# Patient Record
Sex: Male | Born: 1955 | Race: White | Hispanic: No | Marital: Married | Smoking: Former smoker
Health system: Southern US, Community
[De-identification: ages and names within clinical notes are randomized; demographics above are authoritative.]

## PROBLEM LIST (undated history)

## (undated) ENCOUNTER — Emergency Department (HOSPITAL_BASED_OUTPATIENT_CLINIC_OR_DEPARTMENT_OTHER): Payer: Worker's Compensation | Source: Home / Self Care

## (undated) DIAGNOSIS — J302 Other seasonal allergic rhinitis: Secondary | ICD-10-CM

## (undated) HISTORY — PX: HEMORRHOID SURGERY: SHX153

---

## 2011-09-08 ENCOUNTER — Emergency Department (INDEPENDENT_AMBULATORY_CARE_PROVIDER_SITE_OTHER)

## 2011-09-08 ENCOUNTER — Emergency Department (HOSPITAL_BASED_OUTPATIENT_CLINIC_OR_DEPARTMENT_OTHER)
Admission: EM | Admit: 2011-09-08 | Discharge: 2011-09-08 | Disposition: A | Discharge status: Home / Self Care | Attending: Emergency Medicine | Admitting: Emergency Medicine

## 2011-09-08 ENCOUNTER — Encounter (HOSPITAL_BASED_OUTPATIENT_CLINIC_OR_DEPARTMENT_OTHER): Payer: Self-pay | Admitting: *Deleted

## 2011-09-08 DIAGNOSIS — Y9289 Other specified places as the place of occurrence of the external cause: Secondary | ICD-10-CM | POA: Insufficient documentation

## 2011-09-08 DIAGNOSIS — S62639A Displaced fracture of distal phalanx of unspecified finger, initial encounter for closed fracture: Secondary | ICD-10-CM

## 2011-09-08 DIAGNOSIS — M79609 Pain in unspecified limb: Secondary | ICD-10-CM

## 2011-09-08 DIAGNOSIS — S6980XA Other specified injuries of unspecified wrist, hand and finger(s), initial encounter: Secondary | ICD-10-CM | POA: Insufficient documentation

## 2011-09-08 DIAGNOSIS — X500XXA Overexertion from strenuous movement or load, initial encounter: Secondary | ICD-10-CM

## 2011-09-08 DIAGNOSIS — X58XXXA Exposure to other specified factors, initial encounter: Secondary | ICD-10-CM | POA: Insufficient documentation

## 2011-09-08 DIAGNOSIS — S6990XA Unspecified injury of unspecified wrist, hand and finger(s), initial encounter: Secondary | ICD-10-CM

## 2011-09-08 NOTE — ED Notes (Signed)
Pt c/o left ring finger injury

## 2011-09-08 NOTE — ED Provider Notes (Signed)
History     CSN: [id number]  Arrival date & time 09/08/11  1244   First MD Initiated Contact with Patient 09/08/11 1247      Chief Complaint  Patient presents with  . Finger Injury    (Consider location/radiation/quality/duration/timing/severity/associated sxs/prior treatment) HPI Patient presents emergency room after injuring his left index finger while at work. Patient states he was pulling on a rope his left hand when he felt a pop in his ring finger. Patient states it started to  swell and became warm. Patient continues to have pain near the proximal pharynx of his left index finger. The pain increases with movement. He has not had any fever or or redness. History reviewed. No pertinent past medical history.  Past Surgical History  Procedure Date  . Hemm     History reviewed. No pertinent family history.  History  Substance Use Topics  . Smoking status: Former Games developer  . Smokeless tobacco: Not on file  . Alcohol Use: No      Review of Systems  Constitutional: Negative for fever.  Musculoskeletal: Negative for myalgias.  Neurological: Negative for numbness.    Allergies  Review of patient's allergies indicates no known allergies.  Home Medications  No current outpatient prescriptions on file.  BP 156/94  Pulse 89  Temp(Src) 98.1 F (36.7 C) (Oral)  Resp 18  Ht 5\' 10"  (1.778 m)  Wt 210 lb (95.255 kg)  BMI 30.13 kg/m2  SpO2 100%  Physical Exam  Nursing note and vitals reviewed. Constitutional: He appears well-developed and well-nourished. No distress.  HENT:  Head: Normocephalic and atraumatic.  Right Ear: External ear normal.  Left Ear: External ear normal.  Eyes: Conjunctivae are normal. Right eye exhibits no discharge. Left eye exhibits no discharge. No scleral icterus.  Neck: Neck supple. No tracheal deviation present.  Cardiovascular: Normal rate.   Pulmonary/Chest: Effort normal. No stridor. No respiratory distress.  Musculoskeletal: He  exhibits no edema.       Left hand: He exhibits tenderness and bony tenderness. He exhibits normal range of motion, normal two-point discrimination, normal capillary refill, no deformity and no laceration.       Hands: Neurological: He is alert. Cranial nerve deficit: no gross deficits.  Skin: Skin is warm and dry. No rash noted.  Psychiatric: He has a normal mood and affect.    ED Course  Procedures (including critical care time)  Labs Reviewed - No data to display Dg Finger Ring Left  09/08/2011  *RADIOLOGY REPORT*  Clinical Data: Injury  LEFT RING FINGER 2+V  Comparison: None.  Findings: No acute dislocation. Subtle fracture involving the palmar base of the distal phalanx of the ring finger is suspected. Unremarkable soft tissues.  IMPRESSION: Subtle fracture at the base of the distal phalanx of the ring finger on the palmar aspect.  Original Report Authenticated By: Donavan Burnet, M.D.     Diagnosis: Finger injury   MDM  Patient has an abnormality at the base of the distal phalanx of his right finger however there is no tenderness in this region. I am not certain and that is an acute fracture associated with these complaints today. Patient's pain is located primarily at the base of the proximal phalanx. Patient will be placed in a splint and I will have him follow up with orthopedic doctor.       Celene Kras, MD 09/08/11 1351

## 2011-09-13 ENCOUNTER — Ambulatory Visit (INDEPENDENT_AMBULATORY_CARE_PROVIDER_SITE_OTHER): Payer: Worker's Compensation | Admitting: Family Medicine

## 2011-09-13 ENCOUNTER — Encounter: Payer: Self-pay | Admitting: Family Medicine

## 2011-09-13 VITALS — BP 143/87 | HR 80 | Temp 98.7°F | Ht 70.0 in | Wt 210.0 lb

## 2011-09-13 DIAGNOSIS — S6390XA Sprain of unspecified part of unspecified wrist and hand, initial encounter: Secondary | ICD-10-CM

## 2011-09-13 DIAGNOSIS — IMO0001 Reserved for inherently not codable concepts without codable children: Secondary | ICD-10-CM

## 2011-09-13 NOTE — Assessment & Plan Note (Signed)
Do not think patient has a distal phalanx fracture - no pain at site of questionable fracture.  [place] finger would be a possibility with this finding but he has full flexion and strength of DIP.  No other apparent complete tendon tears (central slip, mallet finger) - and would expect decreased strength and more pain if he had a partial tear of one of these tendons (and typically pain near DIP or PIP, not back near MCP).  Believe he likely strained flexor tendon into 4th digit with resultant swelling, pain.  Reassured patient.  Buddy tape for next 2-3 [place] at work.  Heat or ice, nsaids as needed.  Restrictions - no lifting more than 25 pounds and no climbing (due to concern he may fall from height if having trouble gripping fully with right hand due to pain) for next 3 [place].  F/u in office in 2-3 [place] to reassess.  See instructions for further.

## 2011-09-13 NOTE — Progress Notes (Signed)
Subjective:    Patient ID: Donald Choi, male    DOB: 02-25-1956, 56 y.o.   MRN: [id number]  PCP: Deboraha Sprang  HPI 57 yo M here for left ring finger injury.  Patient reports that on 1/15 while at work he was going to climb a metal slide to assess a sensor that was out of alignment. While going to pull himself up with left hand he felt a snap/pop that he though was on palmar aspect of left ring finger and some pain, warmth, swelling over next few hours. He continued working and finished that day's shift. Next morning felt really stiff - informed work and was evaluated in [facility] ED. Had x-rays that showed a questionable distal phalanx fracture but he has no pain in this area of 4th digit. Was placed in a dorsal splint, buddy taped, and referred here. He has improved and pain is mainly palmar and dorsal around 4th MCP joint - stiffness with flexion. No prior left hand injuries. Right handed.  History reviewed. No pertinent past medical history.  No current outpatient prescriptions on file prior to visit.    Past Surgical History  Procedure Date  . Hemorrhoid surgery     No Known Allergies  History   Social History  . Marital Status: Married    Spouse Name: N/A    Number of Children: N/A  . Years of Education: N/A   Occupational History  . Not on file.   Social History Main Topics  . Smoking status: Former Games developer  . Smokeless tobacco: Not on file  . Alcohol Use: No  . Drug Use: Not on file  . Sexually Active: Not on file   Other Topics Concern  . Not on file   Social History Narrative  . No narrative on file    Family History  Problem Relation Age of Onset  . Hypertension Mother   . Sudden death Neg Hx   . Hyperlipidemia Neg Hx   . Heart attack Neg Hx   . Diabetes Neg Hx     BP 143/87  Pulse 80  Temp(Src) 98.7 F (37.1 C) (Oral)  Ht 5\' 10"  (1.778 m)  Wt 210 lb (95.255 kg)  BMI 30.13 kg/m2  Review of Systems See HPI above.    Objective:   Physical  Exam Gen: NAD  L hand: No gross deformity, bruising.  Minimal swelling 4th digit. No malrotation or angulation of digits. Mild TTP palmar > dorsal at base of 4th digit (near MCP).  No TTP throughout 4th digit or hand otherwise. Able to flex and extend at MCP, PIP, DIP with 5/5 strength and no pain currently. Collateral ligaments intact of MCP, PIP, and DIP joints without pain. NVI distally with < 2 sec cap refill.     Assessment & Plan:  1. Left 4th digit injury - Do not think patient has a distal phalanx fracture - no pain at site of questionable fracture.  [place] finger would be a possibility with this finding but he has full flexion and strength of DIP.  No other apparent complete tendon tears (central slip, mallet finger) - and would expect decreased strength and more pain if he had a partial tear of one of these tendons (and typically pain near DIP or PIP, not back near MCP).  Believe he likely strained flexor tendon into 4th digit with resultant swelling, pain.  Reassured patient.  Buddy tape for next 2-3 [place] at work.  Heat or ice, nsaids as needed.  Restrictions -  no lifting more than 25 pounds and no climbing (due to concern he may fall from height if having trouble gripping fully with right hand due to pain) for next 3 [place].  F/u in office in 2-3 [place] to reassess.  See instructions for further.

## 2011-09-13 NOTE — Patient Instructions (Signed)
Your flexor, extensor tendons and collateral ligaments are intact on exam which is a very good thing based on your description of the incident. You have strained the flexor tendon in your ring finger and have swelling in the tendon sheath as a result of this. Buddy tape your middle and ring fingers together when at work. Only restrictions would be no lifting more than 25 pounds and no climbing until I see you back in 2-3 [place]. Ice or heat 15 minutes at a time as needed. Aleve and/or tylenol as needed for pain. We will call you when the paperwork is finished (should by later this afternoon) to come pick it up. Follow up with me in 2-3 [place].

## 2011-09-27 ENCOUNTER — Ambulatory Visit (INDEPENDENT_AMBULATORY_CARE_PROVIDER_SITE_OTHER): Payer: Worker's Compensation | Admitting: Family Medicine

## 2011-09-27 ENCOUNTER — Encounter: Payer: Self-pay | Admitting: Family Medicine

## 2011-09-27 VITALS — BP 126/81 | HR 82 | Temp 98.3°F | Ht 70.0 in | Wt 215.0 lb

## 2011-09-27 DIAGNOSIS — IMO0001 Reserved for inherently not codable concepts without codable children: Secondary | ICD-10-CM

## 2011-09-27 DIAGNOSIS — S6390XA Sprain of unspecified part of unspecified wrist and hand, initial encounter: Secondary | ICD-10-CM

## 2011-09-27 NOTE — Progress Notes (Signed)
Subjective:    Patient ID: Donald Choi, male    DOB: [birth year], 56 y.o.   MRN: [id number]  PCP: Deboraha Sprang  HPI  56 yo M here for s/u left ring finger injury.  1/21: Patient reports that on 1/15 while at work he was going to climb a metal slide to assess a sensor that was out of alignment. While going to pull himself up with left hand he felt a snap/pop that he though was on palmar aspect of left ring finger and some pain, warmth, swelling over next few hours. He continued working and finished that day's shift. Next morning felt really stiff - informed work and was evaluated in [facility] ED. Had x-rays that showed a questionable distal phalanx fracture but he has no pain in this area of 4th digit. Was placed in a dorsal splint, buddy taped, and referred here. He has improved and pain is mainly palmar and dorsal around 4th MCP joint - stiffness with flexion. No prior left hand injuries. Right handed.  2/4: Patient has been buddy taping 3rd and 4th digits together. No problems at work with these, no developing deformity. Able to adhere to restrictions at work. Still stiff especially in the mornings, unable to completely flex 4th digit but much better. Pain is minimal.  Taking aleve as needed (not really needed this).  History reviewed. No pertinent past medical history.  No current outpatient prescriptions on file prior to visit.    Past Surgical History  Procedure Date  . Hemorrhoid surgery     No Known Allergies  History   Social History  . Marital Status: Married    Spouse Name: N/A    Number of Children: N/A  . Years of Education: N/A   Occupational History  . Not on file.   Social History Main Topics  . Smoking status: Former Games developer  . Smokeless tobacco: Not on file  . Alcohol Use: No  . Drug Use: Not on file  . Sexually Active: Not on file   Other Topics Concern  . Not on file   Social History Narrative  . No narrative on file    Family History    Problem Relation Age of Onset  . Hypertension Mother   . Sudden death Neg Hx   . Hyperlipidemia Neg Hx   . Heart attack Neg Hx   . Diabetes Neg Hx     BP 126/81  Pulse 82  Temp(Src) 98.3 F (36.8 C) (Oral)  Ht 5\' 10"  (1.778 m)  Wt 215 lb (97.523 kg)  BMI 30.85 kg/m2  Review of Systems  See HPI above.    Objective:   Physical Exam  Gen: NAD  L hand: No gross deformity, bruising.  Minimal swelling 4th digit. No malrotation or angulation of digits. Minimal TTP palmar at base of 4th digit (near MCP).  No longer with dorsal TTP here.  No TTP throughout 4th digit or hand otherwise. Able to flex and extend at MCP, PIP, DIP with 5/5 strength and no pain currently. Collateral ligaments intact of MCP, PIP, and DIP joints without pain. NVI distally with < 2 sec cap refill.     Assessment & Plan:  1. Left 4th digit injury - Patient improving as expected for 4th digit strain. No developing deformity and excellent ROM/strength - no suggestion of [place], mallet, central slip injuries.  Continue buddy taping only at work until 6 [place] out from injury.  Lifted restriction on lifting but still advised no climbing  (due  to concern he may fall from height if having trouble gripping fully with right hand due to pain).  Heat, nsaids as needed.  F/u in office in 3-4 [place] to reassess.  Discussed red flags (development of deformity - very unlikely) to warrant earlier return.

## 2011-09-27 NOTE — Assessment & Plan Note (Signed)
Patient improving as expected for 4th digit strain. No developing deformity and excellent ROM/strength - no suggestion of [place], mallet, central slip injuries.  Continue buddy taping only at work until 6 [place] out from injury.  Lifted restriction on lifting but still advised no climbing  (due to concern he may fall from height if having trouble gripping fully with right hand due to pain).  Heat, nsaids as needed.  F/u in office in 3-4 [place] to reassess.  Discussed red flags (development of deformity - very unlikely) to warrant earlier return.

## 2011-10-25 ENCOUNTER — Encounter: Payer: Self-pay | Admitting: Family Medicine

## 2011-10-25 ENCOUNTER — Ambulatory Visit (INDEPENDENT_AMBULATORY_CARE_PROVIDER_SITE_OTHER): Admitting: Family Medicine

## 2011-10-25 VITALS — BP 123/80 | HR 88 | Temp 98.5°F | Ht 70.0 in | Wt 215.0 lb

## 2011-10-25 DIAGNOSIS — IMO0001 Reserved for inherently not codable concepts without codable children: Secondary | ICD-10-CM

## 2011-10-25 DIAGNOSIS — S6390XA Sprain of unspecified part of unspecified wrist and hand, initial encounter: Secondary | ICD-10-CM

## 2011-10-25 NOTE — Patient Instructions (Signed)
The ultrasound is reassuring you do not have a tear (or if you do it is very small). You do have increased fluid in the tendon sheath consistent with tendinitis, strain. Continue buddy taping at work, same restrictions. Follow up with me in 4 [place]. Consider occupational therapy if not improving to work out the rest of this stiffness, swelling.

## 2011-10-26 ENCOUNTER — Encounter: Payer: Self-pay | Admitting: Family Medicine

## 2011-10-26 NOTE — Progress Notes (Signed)
Subjective:    Patient ID: Donald Choi, male    DOB: 06/19/1956, 56 y.o.   MRN: [id number]  PCP: Deboraha Sprang  HPI  56 yo M here for f/u left ring finger injury.  1/21: Patient reports that on 1/15 while at work he was going to climb a metal slide to assess a sensor that was out of alignment. While going to pull himself up with left hand he felt a snap/pop that he though was on palmar aspect of left ring finger and some pain, warmth, swelling over next few hours. He continued working and finished that day's shift. Next morning felt really stiff - informed work and was evaluated in [facility] ED. Had x-rays that showed a questionable distal phalanx fracture but he has no pain in this area of 4th digit. Was placed in a dorsal splint, buddy taped, and referred here. He has improved and pain is mainly palmar and dorsal around 4th MCP joint - stiffness with flexion. No prior left hand injuries. Right handed.  2/4: Patient has been buddy taping 3rd and 4th digits together. No problems at work with these, no developing deformity. Able to adhere to restrictions at work. Still stiff especially in the mornings, unable to completely flex 4th digit but much better. Pain is minimal.  Taking aleve as needed (not really needed this).  3/4: Overall patient continues to improve. Still feels stiff and cannot completely flex 4th digit like he can the right 4th digit due to stiffness. Has been buddy taping. Not taking any medications. Otherwise no complaints.  History reviewed. No pertinent past medical history.  No current outpatient prescriptions on file prior to visit.    Past Surgical History  Procedure Date  . Hemorrhoid surgery     No Known Allergies  History   Social History  . Marital Status: Married    Spouse Name: N/A    Number of Children: N/A  . Years of Education: N/A   Occupational History  . Not on file.   Social History Main Topics  . Smoking status: Former Games developer    . Smokeless tobacco: Not on file  . Alcohol Use: No  . Drug Use: Not on file  . Sexually Active: Not on file   Other Topics Concern  . Not on file   Social History Narrative  . No narrative on file    Family History  Problem Relation Age of Onset  . Hypertension Mother   . Sudden death Neg Hx   . Hyperlipidemia Neg Hx   . Heart attack Neg Hx   . Diabetes Neg Hx     BP 123/80  Pulse 88  Temp(Src) 98.5 F (36.9 C) (Oral)  Ht 5\' 10"  (1.778 m)  Wt 215 lb (97.523 kg)  BMI 30.85 kg/m2  Review of Systems  See HPI above.    Objective:   Physical Exam  Gen: NAD  L hand: No gross deformity, bruising.  Minimal swelling 4th digit. No malrotation or angulation of digits. Minimal TTP palmar at base of 4th digit (near MCP).  No dorsal TTP. Able to flex and extend at MCP, PIP, DIP with 5/5 strength and no pain currently. Collateral ligaments intact of MCP, PIP, and DIP joints without pain. NVI distally with < 2 sec cap refill.  MSK u/s: Long and trans views show flexor tendons appear intact though there is a target sign on trans view compared to other tendons suggesting increased fluid in tendon sheath (though this is nonspecific).  Assessment & Plan:  1. Left 4th digit injury - Patient improving as expected for 4th digit strain though this has been slow.  Ultrasound, while not as specific as MRI, shows no evidence of complete tendon rupture and this would fit with his exam.  Still is stiff and with fluid in tendon sheath.  With full motion of all joints though, would be hard pressed to move forward with MRI as I don't think this would change our management at this time even if he had a small partial tear of flexor tendons.  Continue buddy taping at work.  Discussed possibility of occupational therapy to help vs MRI if he doesn't improve.  Continue current restrictions.  If not improving will consider either OT or MRI.

## 2011-10-26 NOTE — Assessment & Plan Note (Signed)
Patient improving as expected for 4th digit strain though this has been slow.  Ultrasound, while not as specific as MRI, shows no evidence of complete tendon rupture and this would fit with his exam.  Still is stiff and with fluid in tendon sheath.  With full motion of all joints though, would be hard pressed to move forward with MRI as I don't think this would change our management at this time even if he had a small partial tear of flexor tendons.  Continue buddy taping at work.  Discussed possibility of occupational therapy to help vs MRI if he doesn't improve.  Continue current restrictions.  If not improving will consider either OT or MRI.

## 2011-11-22 ENCOUNTER — Ambulatory Visit (INDEPENDENT_AMBULATORY_CARE_PROVIDER_SITE_OTHER): Admitting: Family Medicine

## 2011-11-22 ENCOUNTER — Encounter: Payer: Self-pay | Admitting: Family Medicine

## 2011-11-22 VITALS — BP 127/82 | HR 84 | Temp 97.7°F | Ht 70.0 in | Wt 215.0 lb

## 2011-11-22 DIAGNOSIS — S6390XA Sprain of unspecified part of unspecified wrist and hand, initial encounter: Secondary | ICD-10-CM

## 2011-11-22 DIAGNOSIS — IMO0001 Reserved for inherently not codable concepts without codable children: Secondary | ICD-10-CM

## 2011-11-22 NOTE — Assessment & Plan Note (Signed)
Doing much better with home rehab.  Ultrasound last visit was reassuring.  He feels he is ready to go back to full duty and I agree - strength, motion at a point where he is at low risk of reinjury.  We discussed taking care when climbing ladders - rely on legs and right arm just to be cautious.  Buddy tape only if needed for comfort.  F/u prn.  Consider OT if pain recurs.

## 2011-11-22 NOTE — Progress Notes (Signed)
Subjective:    Patient ID: Donald Choi, male    DOB: 07-23-1956, 56 y.o.   MRN: [id number]  PCP: Deboraha Sprang  HPI  56 yo M here for f/u left ring finger injury.  1/21: Patient reports that on 1/15 while at work he was going to climb a metal slide to assess a sensor that was out of alignment. While going to pull himself up with left hand he felt a snap/pop that he though was on palmar aspect of left ring finger and some pain, warmth, swelling over next few hours. He continued working and finished that day's shift. Next morning felt really stiff - informed work and was evaluated in [facility] ED. Had x-rays that showed a questionable distal phalanx fracture but he has no pain in this area of 4th digit. Was placed in a dorsal splint, buddy taped, and referred here. He has improved and pain is mainly palmar and dorsal around 4th MCP joint - stiffness with flexion. No prior left hand injuries. Right handed.  2/4: Patient has been buddy taping 3rd and 4th digits together. No problems at work with these, no developing deformity. Able to adhere to restrictions at work. Still stiff especially in the mornings, unable to completely flex 4th digit but much better. Pain is minimal.  Taking aleve as needed (not really needed this).  3/4: Overall patient continues to improve. Still feels stiff and cannot completely flex 4th digit like he can the right 4th digit due to stiffness. Has been buddy taping. Not taking any medications. Otherwise no complaints.  4/1: Patient has been doing home rehab with stress ball and doing very well. Improved strength in left ring finger and minimal pain. Not taking any medications. Stiffness is much better. No other complaints.  History reviewed. No pertinent past medical history.  No current outpatient prescriptions on file prior to visit.    Past Surgical History  Procedure Date  . Hemorrhoid surgery     No Known Allergies  History   Social  History  . Marital Status: Married    Spouse Name: N/A    Number of Children: N/A  . Years of Education: N/A   Occupational History  . Not on file.   Social History Main Topics  . Smoking status: Former Games developer  . Smokeless tobacco: Not on file  . Alcohol Use: No  . Drug Use: Not on file  . Sexually Active: Not on file   Other Topics Concern  . Not on file   Social History Narrative  . No narrative on file    Family History  Problem Relation Age of Onset  . Hypertension Mother   . Sudden death Neg Hx   . Hyperlipidemia Neg Hx   . Heart attack Neg Hx   . Diabetes Neg Hx     BP 127/82  Pulse 84  Temp(Src) 97.7 F (36.5 C) (Oral)  Ht 5\' 10"  (1.778 m)  Wt 215 lb (97.523 kg)  BMI 30.85 kg/m2  Review of Systems  See HPI above.    Objective:   Physical Exam  Gen: NAD  L hand: No gross deformity, bruising.  No swelling 4th digit. No malrotation or angulation of digits. Minimal TTP palmar at base of 4th digit (near MCP).  No dorsal TTP. FROM at MCP, PIP, DIP with 5/5 strength and no pain currently 4th digit. Collateral ligaments intact of MCP, PIP, and DIP joints without pain. NVI distally with < 2 sec cap refill.     Assessment &  Plan:  1. Left 4th digit injury - Doing much better with home rehab.  Ultrasound last visit was reassuring.  He feels he is ready to go back to full duty and I agree - strength, motion at a point where he is at low risk of reinjury.  We discussed taking care when climbing ladders - rely on legs and right arm just to be cautious.  Buddy tape only if needed for comfort.  F/u prn.  Consider OT if pain recurs.

## 2013-01-15 ENCOUNTER — Emergency Department (HOSPITAL_BASED_OUTPATIENT_CLINIC_OR_DEPARTMENT_OTHER): Payer: Federal, State, Local not specified - PPO

## 2013-01-15 ENCOUNTER — Emergency Department (HOSPITAL_BASED_OUTPATIENT_CLINIC_OR_DEPARTMENT_OTHER)
Admission: EM | Admit: 2013-01-15 | Discharge: 2013-01-15 | Disposition: A | Discharge status: Home / Self Care | Payer: Federal, State, Local not specified - PPO | Attending: Emergency Medicine | Admitting: Emergency Medicine

## 2013-01-15 ENCOUNTER — Encounter (HOSPITAL_BASED_OUTPATIENT_CLINIC_OR_DEPARTMENT_OTHER): Payer: Self-pay | Admitting: *Deleted

## 2013-01-15 DIAGNOSIS — Z87891 Personal history of nicotine dependence: Secondary | ICD-10-CM | POA: Insufficient documentation

## 2013-01-15 DIAGNOSIS — S91309A Unspecified open wound, unspecified foot, initial encounter: Secondary | ICD-10-CM | POA: Insufficient documentation

## 2013-01-15 DIAGNOSIS — W268XXA Contact with other sharp object(s), not elsewhere classified, initial encounter: Secondary | ICD-10-CM | POA: Insufficient documentation

## 2013-01-15 DIAGNOSIS — Y9289 Other specified places as the place of occurrence of the external cause: Secondary | ICD-10-CM | POA: Insufficient documentation

## 2013-01-15 DIAGNOSIS — Y93E5 Activity, floor mopping and cleaning: Secondary | ICD-10-CM | POA: Insufficient documentation

## 2013-01-15 DIAGNOSIS — Z23 Encounter for immunization: Secondary | ICD-10-CM | POA: Insufficient documentation

## 2013-01-15 DIAGNOSIS — S91311A Laceration without foreign body, right foot, initial encounter: Secondary | ICD-10-CM

## 2013-01-15 HISTORY — DX: Other seasonal allergic rhinitis: J30.2

## 2013-01-15 MED ORDER — TETANUS-DIPHTH-ACELL PERTUSSIS 5-2.5-18.5 LF-MCG/0.5 IM SUSP
0.5000 mL | Freq: Once | INTRAMUSCULAR | Status: AC
  Administered 2013-01-15: 0.5 mL via INTRAMUSCULAR
  Filled 2013-01-15: qty 0.5

## 2013-01-15 NOTE — ED Provider Notes (Signed)
History     CSN: [id number]  Arrival date & time 01/15/13  1145   First MD Initiated Contact with Patient 01/15/13 1207      Chief Complaint  Patient presents with  . Extremity Laceration    (Consider location/radiation/quality/duration/timing/severity/associated sxs/prior treatment) Patient is a 57 y.o. male presenting with skin laceration. The history is provided by the patient. No language interpreter was used.  Laceration Location:  Foot Foot laceration location:  Sole of R foot Length (cm):  3 Depth:  Through dermis Bleeding: controlled   Time since incident:  2 hours Laceration mechanism:  Unable to specify Pain details:    Quality:  Aching and sharp   Severity:  Mild   Progression:  Unchanged Foreign body present:  No foreign bodies Relieved by:  None tried Worsened by:  Pressure Ineffective treatments:  None tried Tetanus status:  Out of date    Past Medical History  Diagnosis Date  . Seasonal allergies     Past Surgical History  Procedure Laterality Date  . Hemorrhoid surgery      Family History  Problem Relation Age of Onset  . Hypertension Mother   . Sudden death Neg Hx   . Hyperlipidemia Neg Hx   . Heart attack Neg Hx   . Diabetes Neg Hx     History  Substance Use Topics  . Smoking status: Former Games developer  . Smokeless tobacco: Not on file  . Alcohol Use: No      Review of Systems  Musculoskeletal: Positive for myalgias. Negative for joint swelling and arthralgias.  Skin: Positive for wound. Negative for color change and rash.    Allergies  Review of patient's allergies indicates no known allergies.  Home Medications   Current Outpatient Rx  Name  Route  Sig  Dispense  Refill  . meloxicam (MOBIC) 15 MG tablet   Oral   Take 15 mg by mouth daily.           BP 153/86  Pulse 90  Temp(Src) 98.3 F (36.8 C) (Oral)  Resp 16  SpO2 98%  Physical Exam  Nursing note and vitals reviewed. Constitutional: He appears  well-developed and well-nourished. No distress.  HENT:  Head: Normocephalic and atraumatic.  Eyes: Conjunctivae are normal. No scleral icterus.  Neck: Normal range of motion.  Cardiovascular: Normal rate, regular rhythm and normal heart sounds.   Pulmonary/Chest: Effort normal and breath sounds normal. No respiratory distress. He has no wheezes.  Musculoskeletal: Normal range of motion. He exhibits tenderness ( right heel).  3cm laceration to plantar aspect of right foot, near heel. No active bleeding, no foreign body visible.  Laceration does go into dermis layer.  Neurological: He is alert.  Skin: Skin is warm and dry. He is not diaphoretic.  Psychiatric: He has a normal mood and affect. His behavior is normal.    ED Course  LACERATION REPAIR Date/Time: 01/15/2013 12:44 PM Performed by: Junius Finner Authorized by: Junius Finner Consent: Verbal consent obtained. Risks and benefits: risks, benefits and alternatives were discussed Consent given by: patient Patient understanding: patient states understanding of the procedure being performed Patient consent: the patient's understanding of the procedure matches consent given Patient identity confirmed: verbally with patient Body area: lower extremity Location details: right foot Laceration length: 3 cm Foreign bodies: unknown Tendon involvement: none Nerve involvement: none Vascular damage: no Patient sedated: no Irrigation solution: saline Irrigation method: syringe Amount of cleaning: standard Debridement: none Degree of undermining: none Dressing: pressure dressing Patient  tolerance: Patient tolerated the procedure well with no immediate complications.   (including critical care time)  Labs Reviewed - No data to display Dg Foot Complete Right  01/15/2013   *RADIOLOGY REPORT*  Clinical Data: Puncture wound.  RIGHT FOOT COMPLETE - 3+ VIEW  Comparison: None  Findings: The joint spaces are maintained.  No acute bony findings  or radiopaque foreign body.  A moderate sized calcaneal heel spur is noted along with os trigonum.  IMPRESSION: No acute bony findings or radiopaque foreign body.   Original Report Authenticated By: Rudie Meyer, M.D.     1. Laceration of right foot, initial encounter       MDM  Plain films of right foot, looking for foreign body, pt unsure what he cut foot on.   Plain films: no acute bony findings or radiopaque foreign body  Wound cleaned and dressed, see procedure note.  Bleeding well controlled. Laceration not gapping open.  No sutures needed.  Tetanus given in ED.  Pt advised to keep clean and covered.  F/u with PCP in 3 days if not improving.  Seek medical attention if signs of infection, discussed with patient.  May take tylenol or ibuprofen as needed for pain.  Vitals: unremarkable. Discharged in stable condition.    Discussed pt with attending during ED encounter.        Junius Finner, PA-C 01/15/13 1315

## 2013-01-15 NOTE — ED Notes (Signed)
Patient states he was cleaning a pool, and stepped on something on the ground beside the pool.  Laceration to plantar foot, bleeding controlled.

## 2013-01-15 NOTE — ED Provider Notes (Signed)
Medical screening examination/treatment/procedure(s) were performed by non-physician practitioner and as supervising physician I was immediately available for consultation/collaboration.  Biff Rutigliano, MD 01/15/13 1520 

## 2013-06-28 ENCOUNTER — Other Ambulatory Visit: Payer: Self-pay

## 2013-09-03 ENCOUNTER — Ambulatory Visit: Payer: Federal, State, Local not specified - PPO | Admitting: Podiatry

## 2013-09-05 ENCOUNTER — Ambulatory Visit: Payer: Federal, State, Local not specified - PPO | Admitting: Podiatry

## 2013-09-17 ENCOUNTER — Encounter: Payer: Self-pay | Admitting: Podiatry

## 2013-09-17 ENCOUNTER — Ambulatory Visit (INDEPENDENT_AMBULATORY_CARE_PROVIDER_SITE_OTHER): Payer: Federal, State, Local not specified - PPO

## 2013-09-17 ENCOUNTER — Ambulatory Visit (INDEPENDENT_AMBULATORY_CARE_PROVIDER_SITE_OTHER): Payer: Federal, State, Local not specified - PPO | Admitting: Podiatry

## 2013-09-17 VITALS — BP 143/86 | HR 86 | Resp 12 | Ht 70.0 in | Wt 215.0 lb

## 2013-09-17 DIAGNOSIS — R52 Pain, unspecified: Secondary | ICD-10-CM

## 2013-09-17 DIAGNOSIS — M722 Plantar fascial fibromatosis: Secondary | ICD-10-CM

## 2013-09-17 MED ORDER — TRIAMCINOLONE ACETONIDE 10 MG/ML IJ SUSP: 10.0000 mg | Freq: Once | INTRAMUSCULAR | Status: DC

## 2013-09-17 NOTE — Progress Notes (Signed)
   Subjective:    Patient ID: Donald Choi, male    DOB: 08/02/1956, 58 y.o.   MRN: [id number]  HPI Comments: '' LT FOOT START HURTING AGAIN.''  Foot Pain      Review of Systems  All other systems reviewed and are negative.       Objective:   Physical Exam        Assessment & Plan:

## 2013-09-17 NOTE — Progress Notes (Signed)
Subjective:     Patient ID: Donald Choi, male   DOB: 1956-05-04, 58 y.o.   MRN: [id number]  HPI patient states I was having pain in my ankle which seems a little better but it still seems to come from my heel which is improved over where it was but still sore at times   Review of Systems     Objective:   Physical Exam Neurovascular status intact with continued discomfort left plantar fascia at the insertion into the calcaneus    Assessment:     Plantar fasciitis left improved but still present    Plan:     Reinjected the plantar fascia 3 mg Kenalog 5 of Xylocaine Marcaine mixture to reduce inflammation and discussed making sure he walks on his entire heel

## 2013-09-17 NOTE — Progress Notes (Signed)
N- SORE  L- LT FOOT  D- 1 MONTH O-SLOWLY C-BETTER A-PRESSURE T- NIGHT SPLINT, ICING, AND ANTI INFLAMMATORY MEDICATION.

## 2015-03-31 ENCOUNTER — Encounter: Payer: Self-pay | Admitting: Neurology

## 2015-03-31 ENCOUNTER — Ambulatory Visit (INDEPENDENT_AMBULATORY_CARE_PROVIDER_SITE_OTHER): Payer: Federal, State, Local not specified - PPO | Admitting: Neurology

## 2015-03-31 VITALS — BP 104/78 | HR 76 | Ht 70.0 in | Wt 229.0 lb

## 2015-03-31 DIAGNOSIS — G5711 Meralgia paresthetica, right lower limb: Secondary | ICD-10-CM | POA: Diagnosis not present

## 2015-03-31 DIAGNOSIS — G609 Hereditary and idiopathic neuropathy, unspecified: Secondary | ICD-10-CM

## 2015-03-31 DIAGNOSIS — G250 Essential tremor: Secondary | ICD-10-CM

## 2015-03-31 NOTE — Progress Notes (Signed)
Subjective:   Donald Choi was seen in consultation in the movement disorder clinic at the request of Dr. Silvestre Moment.  The evaluation is for tremor.  The patient is a 59 y.o. left handed male with a history of tremor.  The left hand is worse than the right.  The tremor has been going on for at least 3 years.   Pt states that others note it more than he does; he thinks that people notice it when he is drinking coffee.  He states that it is present with use, especially when the arm is unrested.   There is no family hx of tremor.    Affected by caffeine:  Unknown (drinks 2 mugs coffee in AM, 3 glasses coffee at work; 1-2 cans mountain dew a day) Affected by alcohol:  unknown (drinks 1 can beer per day) Affected by stress:  Yes.    (going through divorce) Affected by fatigue: unknown Spills soup if on spoon:  No. (but puts bowl close to the mouth) Spills glass of liquid if full:  No. Affects ADL's (tying shoes, brushing teeth, etc):  No.  Current/Previously tried tremor medications: none  Current medications that may exacerbate tremor:  none  Outside reports reviewed: historical medical records and referral letter/letters.  No Known Allergies  Outpatient Encounter Prescriptions as of 03/31/2015  Medication Sig  . [DISCONTINUED] HYDROcodone-acetaminophen (NORCO/VICODIN) 5-325 MG per tablet   . [DISCONTINUED] meloxicam (MOBIC) 15 MG tablet Take 15 mg by mouth daily.  . [DISCONTINUED] triamcinolone acetonide (KENALOG) 10 MG/ML injection 10 mg    No facility-administered encounter medications on file as of 03/31/2015.    Past Medical History  Diagnosis Date  . Seasonal allergies     Past Surgical History  Procedure Laterality Date  . Hemorrhoid surgery      History   Social History  . Marital Status: Married    Spouse Name: N/A  . Number of Children: N/A  . 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  Smoking status: Former Smoker    Quit date: 03/30/1992  . Smokeless tobacco: Not on file  . Alcohol Use: 0.0 oz/week    0 Standard drinks or equivalent per week     Comment: 1 beer per day  . Drug Use: No  . Sexual Activity: Not on file   Other Topics Concern  . Not on file   Social History Narrative    Family Status  Relation Status Death Age  . Mother Alive     ? HTN  . Father Deceased     lung cancer, CHF  . Sister Alive     healthy  . Son Alive     healthy    Review of Systems Admits to intermittent paresthesias of the right lateral thigh.  A complete 10 system ROS was obtained and was negative apart from what is mentioned.   Objective:   VITALS:   Filed Vitals:   03/31/15 0930  BP: 104/78  Pulse: 76  Height:  (1.778 m)  Weight: 229 lb (103.874 kg)   Gen:  Appears stated age and in NAD. HEENT:  Normocephalic, atraumatic. The mucous membranes are moist. The superficial temporal arteries are without ropiness or tenderness. Cardiovascular: Regular rate and rhythm. Lungs: Clear to auscultation bilaterally. Neck: There are no carotid bruits noted bilaterally.  NEUROLOGICAL:  Orientation:  The patient is alert and oriented  x 3.  Recent and remote memory are intact.  Attention span and concentration are normal.  Able to name objects and repeat without trouble.  Fund of knowledge is appropriate Cranial nerves: There is good facial symmetry. The pupils are equal round and reactive to light bilaterally. Fundoscopic exam reveals clear disc margins bilaterally. Extraocular muscles are intact and visual fields are full to confrontational testing. Speech is fluent and clear. Soft palate rises symmetrically and there is no tongue deviation. Hearing is intact to conversational tone. Tone: Tone is good throughout. Sensation: Sensation is intact to light touch and pinprick throughout (facial, trunk, extremities). Vibration is decreased bilateral big toe. There is no  extinction with double simultaneous stimulation. There is no sensory dermatomal level identified. Coordination:  The patient has no dysdiadichokinesia or dysmetria. Motor: Strength is 5/5 in the bilateral upper and lower extremities.  Shoulder shrug is equal bilaterally.  There is no pronator drift.  There are no fasciculations noted. DTR's: Deep tendon reflexes are 2/4 at the bilateral biceps, triceps, brachioradialis, patella and achilles.  Plantar responses are downgoing bilaterally. Gait and Station: The patient is able to ambulate without difficulty. The patient is able to heel toe walk without any difficulty. The patient is able to ambulate in a tandem fashion. The patient is able to stand in the Romberg position.   MOVEMENT EXAM: Tremor:  There is tremor on the left of the outstretched hand, but not significantly on the right.  There is evidence of tremor with Archimedes spirals, most significantly on the left.  He spills water when pouring it from one glass to another.       Assessment/Plan:   1.  Essential Tremor.  -This is fairly asymmetric, with the left being most prominent and very little being noticed on the right at all.  I do think that the patient's significant caffeine use is worsening the tremor.  In addition, he has had a stressful months as he is going through a divorce.   However, overall the patient's tremor really does not bother him on a day-to-day basis.  We talked about decreasing the amount of caffeine.  I will try to get a copy of labs that were apparently recently done through his primary care physician.  I do not have those currently.  He does not know if his thyroid was checked.  I suspect that it was as it was his yearly physical.  We did talk about various treatments, both medical and surgical, but he really is not interested even in medication.  I did tell him, however, that given the asymmetric nature I would like to go ahead and get an MRI of the brain.  I expect to  see some degree of small vessel disease, but want to make sure that I am not missing anything else.  He was agreeable.  We will call him with those results. 2.  Possible peripheral neuropathy  -The patient does have some mild evidence of this on examination.  He does tell me that his recent blood work showed "borderline" diabetes.  This could be the etiology.  We talked about proper diet and exercise. 3.  Meralgia paresthetica, right  -This is intermittent according to the patient and not particularly bothersome right now. 4.  He will follow-up with me on an as-needed basis.  Greater than 50% of the 60 minute visit was spent in counseling, as above.

## 2015-03-31 NOTE — Patient Instructions (Signed)
We have scheduled you at [facility] for your OPEN MRI on 04/07/2015 at 9:30 am. Please arrive 30 minutes prior and go to [facility]. If you need to reschedule for any reason please call [phone].

## 2015-04-04 ENCOUNTER — Telehealth: Payer: Self-pay | Admitting: Neurology

## 2015-04-04 DIAGNOSIS — R251 Tremor, unspecified: Secondary | ICD-10-CM

## 2015-04-04 DIAGNOSIS — R739 Hyperglycemia, unspecified: Secondary | ICD-10-CM

## 2015-04-04 NOTE — Telephone Encounter (Signed)
Patient made aware the need for labs. He will have these drawn next Tuesday. He knows to stop by our office to get on the lab schedule first. Orders entered.

## 2015-04-04 NOTE — Telephone Encounter (Signed)
Tell pt that I got copy of labs from PCP as I told him that I would.  Thyroid wasn't checked so we should probably do that along with few others if he is agreeable.  His fasting glu was just hair high so order HgBA1c (dx: hyperglycemia, tremor), TSH (dx: tremor), CMP

## 2015-04-08 ENCOUNTER — Other Ambulatory Visit (INDEPENDENT_AMBULATORY_CARE_PROVIDER_SITE_OTHER): Payer: Federal, State, Local not specified - PPO

## 2015-04-08 DIAGNOSIS — R251 Tremor, unspecified: Secondary | ICD-10-CM

## 2015-04-08 DIAGNOSIS — R739 Hyperglycemia, unspecified: Secondary | ICD-10-CM | POA: Diagnosis not present

## 2015-04-08 LAB — COMPREHENSIVE METABOLIC PANEL
ALK PHOS: 46 U/L (ref 39–117)
ALT: 20 U/L (ref 0–53)
AST: 21 U/L (ref 0–37)
Albumin: 4.3 g/dL (ref 3.5–5.2)
BILIRUBIN TOTAL: 0.5 mg/dL (ref 0.2–1.2)
BUN: 17 mg/dL (ref 6–23)
CO2: 28 meq/L (ref 19–32)
CREATININE: 1.05 mg/dL (ref 0.40–1.50)
Calcium: 9.4 mg/dL (ref 8.4–10.5)
Chloride: 106 mEq/L (ref 96–112)
GFR: 77.31 mL/min (ref >60.00)
GLUCOSE: 121 mg/dL — AB (ref 70–99)
Potassium: 4.6 mEq/L (ref 3.5–5.1)
Sodium: 139 mEq/L (ref 135–145)
TOTAL PROTEIN: 7.2 g/dL (ref 6.0–8.3)

## 2015-04-08 LAB — HEMOGLOBIN A1C: Hgb A1c MFr Bld: 5.7 % (ref 4.6–6.5)

## 2015-04-08 LAB — TSH: TSH: 4.97 u[IU]/mL — ABNORMAL HIGH (ref 0.35–4.50)

## 2015-04-10 ENCOUNTER — Telehealth: Payer: Self-pay | Admitting: Neurology

## 2015-04-10 NOTE — Telephone Encounter (Signed)
Left message on machine for patient to call back. To make him aware of nothing new on MR Brain and TSH is a little high - to follow up with PCP. Labs forwarded to PCP. Awaiting call back.

## 2015-04-10 NOTE — Telephone Encounter (Signed)
MRI brain reviewed.  Rare punctate (2) T2 hyperintensities subcortical white matter on L.  You can let pt know that nothing new on MRI brain and looked fine

## 2015-04-10 NOTE — Telephone Encounter (Signed)
Patient made aware of results. He will follow up with PCP.

## 2015-04-10 NOTE — Telephone Encounter (Signed)
Patient returning your call.

## 2015-04-16 ENCOUNTER — Encounter: Payer: Self-pay | Admitting: Neurology

## 2015-04-21 ENCOUNTER — Encounter: Payer: Self-pay | Admitting: Neurology

## 2016-02-18 ENCOUNTER — Ambulatory Visit (INDEPENDENT_AMBULATORY_CARE_PROVIDER_SITE_OTHER): Payer: Federal, State, Local not specified - PPO | Admitting: Podiatry

## 2016-02-18 ENCOUNTER — Encounter: Payer: Self-pay | Admitting: Podiatry

## 2016-02-18 VITALS — BP 128/85 | HR 86 | Resp 16

## 2016-02-18 DIAGNOSIS — B372 Candidiasis of skin and nail: Secondary | ICD-10-CM | POA: Diagnosis not present

## 2016-02-18 DIAGNOSIS — M722 Plantar fascial fibromatosis: Secondary | ICD-10-CM

## 2016-02-18 MED ORDER — TERBINAFINE HCL 250 MG PO TABS: ORAL_TABLET | ORAL | Status: AC

## 2016-02-18 NOTE — Progress Notes (Signed)
   Subjective:    Patient ID: Donald Choi, male    DOB: 11/19/1955, 60 y.o.   MRN: [id number]  HPI    Review of Systems  All other systems reviewed and are negative.      Objective:   Physical Exam        Assessment & Plan:

## 2016-02-18 NOTE — Progress Notes (Signed)
Subjective:     Patient ID: Donald Choi, male   DOB: [birth year], 60 y.o.   MRN: [id number]  HPI patient continues to experience discomfort in the left heel that occurs at times and he continues to utilize orthotics and range of motion activities along with night splint   Review of Systems     Objective:   Physical Exam Neurovascular status unchanged with patient found to have mild discomfort plantar heel left localized in nature with no proximal edema erythema or drainage noted    Assessment:     Low grade fasciitis symptoms that are still present but improving    Plan:     Continue with orthotics and continue with other activities and will be seen back as needed. Also at this time went ahead and discussed his fungal digital infection and we placed him on a Lamisil pulse pack

## 2016-03-08 ENCOUNTER — Telehealth: Payer: Self-pay | Admitting: *Deleted

## 2016-03-08 ENCOUNTER — Encounter: Payer: Self-pay | Admitting: Podiatry

## 2016-03-08 NOTE — Telephone Encounter (Signed)
Pt presented to office request handicap sticker.  Dr. Everlena Cooperegal okayed 3 months handicap sticker.

## 2017-09-26 ENCOUNTER — Ambulatory Visit: Payer: Federal, State, Local not specified - PPO | Admitting: Podiatry

## 2017-10-06 ENCOUNTER — Other Ambulatory Visit: Payer: Self-pay | Admitting: Podiatry

## 2017-10-06 ENCOUNTER — Ambulatory Visit (INDEPENDENT_AMBULATORY_CARE_PROVIDER_SITE_OTHER): Payer: Federal, State, Local not specified - PPO

## 2017-10-06 ENCOUNTER — Encounter: Payer: Self-pay | Admitting: Podiatry

## 2017-10-06 ENCOUNTER — Ambulatory Visit: Payer: Federal, State, Local not specified - PPO | Admitting: Podiatry

## 2017-10-06 DIAGNOSIS — M722 Plantar fascial fibromatosis: Secondary | ICD-10-CM

## 2017-10-06 DIAGNOSIS — B372 Candidiasis of skin and nail: Secondary | ICD-10-CM | POA: Diagnosis not present

## 2017-10-06 DIAGNOSIS — M79671 Pain in right foot: Secondary | ICD-10-CM

## 2017-10-06 MED ORDER — TRIAMCINOLONE ACETONIDE 10 MG/ML IJ SUSP
10.0000 mg | Freq: Once | INTRAMUSCULAR | Status: AC
  Administered 2017-10-06: 10 mg

## 2017-10-07 NOTE — Progress Notes (Signed)
Subjective:   Patient ID: Donald Choi, male   DOB: 62 y.o.   MRN: [id number]   HPI Patient presents stating he continues to have chronic plantar fasciitis left over right that he uses night splint for ice the left foot has become slightly more acute recently.  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  Reappoint to recheck  X-rays indicate spur formation with no indications of stress fracture or arthritis

## 2018-06-21 DIAGNOSIS — B351 Tinea unguium: Secondary | ICD-10-CM

## 2018-09-29 ENCOUNTER — Other Ambulatory Visit: Payer: Self-pay | Admitting: Family

## 2018-09-29 ENCOUNTER — Other Ambulatory Visit (HOSPITAL_COMMUNITY): Payer: Self-pay | Admitting: Surgical

## 2018-09-29 ENCOUNTER — Other Ambulatory Visit: Payer: Self-pay | Admitting: Surgical

## 2018-09-29 ENCOUNTER — Other Ambulatory Visit: Payer: Self-pay | Admitting: Behavioral Health

## 2018-09-29 DIAGNOSIS — R972 Elevated prostate specific antigen [PSA]: Secondary | ICD-10-CM

## 2018-10-10 ENCOUNTER — Ambulatory Visit (HOSPITAL_COMMUNITY)
Admission: RE | Admit: 2018-10-10 | Discharge: 2018-10-10 | Disposition: A | Discharge status: Home / Self Care | Payer: Federal, State, Local not specified - PPO | Source: Ambulatory Visit | Attending: Surgical | Admitting: Surgical

## 2018-10-10 DIAGNOSIS — R972 Elevated prostate specific antigen [PSA]: Secondary | ICD-10-CM | POA: Diagnosis present

## 2018-10-10 LAB — POCT I-STAT CREATININE: Creatinine, Ser: 1.1 mg/dL (ref 0.61–1.24)

## 2018-10-10 MED ORDER — GADOBUTROL 1 MMOL/ML IV SOLN
10.0000 mL | Freq: Once | INTRAVENOUS | Status: AC | PRN
  Administered 2018-10-10: 10 mL via INTRAVENOUS

## 2018-10-10 MED ORDER — GADOBUTROL 1 MMOL/ML IV SOLN: 10.0000 mL | Freq: Once | INTRAVENOUS | Status: DC | PRN

## 2018-10-10 MED ORDER — LIDOCAINE HCL URETHRAL/MUCOSAL 2 % EX GEL
CUTANEOUS | Status: AC
  Filled 2018-10-10: qty 30

## 2018-10-18 ENCOUNTER — Encounter: Payer: Self-pay | Admitting: Podiatry

## 2019-09-05 ENCOUNTER — Encounter (HOSPITAL_BASED_OUTPATIENT_CLINIC_OR_DEPARTMENT_OTHER): Payer: Self-pay | Admitting: *Deleted

## 2019-09-05 ENCOUNTER — Ambulatory Visit (HOSPITAL_COMMUNITY)
Admission: EM | Admit: 2019-09-05 | Discharge: 2019-09-05 | Disposition: A | Discharge status: Home / Self Care | Payer: Federal, State, Local not specified - PPO

## 2019-09-05 ENCOUNTER — Other Ambulatory Visit: Payer: Self-pay

## 2019-09-05 ENCOUNTER — Emergency Department (HOSPITAL_BASED_OUTPATIENT_CLINIC_OR_DEPARTMENT_OTHER)
Admission: EM | Admit: 2019-09-05 | Discharge: 2019-09-05 | Disposition: A | Discharge status: Home / Self Care | Attending: Emergency Medicine | Admitting: Emergency Medicine

## 2019-09-05 DIAGNOSIS — Y929 Unspecified place or not applicable: Secondary | ICD-10-CM | POA: Diagnosis not present

## 2019-09-05 DIAGNOSIS — Y990 Civilian activity done for income or pay: Secondary | ICD-10-CM | POA: Diagnosis not present

## 2019-09-05 DIAGNOSIS — Z87891 Personal history of nicotine dependence: Secondary | ICD-10-CM | POA: Insufficient documentation

## 2019-09-05 DIAGNOSIS — Y9389 Activity, other specified: Secondary | ICD-10-CM | POA: Insufficient documentation

## 2019-09-05 DIAGNOSIS — X503XXA Overexertion from repetitive movements, initial encounter: Secondary | ICD-10-CM | POA: Insufficient documentation

## 2019-09-05 DIAGNOSIS — S8992XA Unspecified injury of left lower leg, initial encounter: Secondary | ICD-10-CM | POA: Diagnosis present

## 2019-09-05 DIAGNOSIS — M7042 Prepatellar bursitis, left knee: Secondary | ICD-10-CM | POA: Diagnosis not present

## 2019-09-05 MED ORDER — IBUPROFEN 600 MG PO TABS: 600.0000 mg | ORAL_TABLET | Freq: Four times a day (QID) | ORAL | 0 refills | Status: AC | PRN

## 2019-09-05 NOTE — ED Provider Notes (Signed)
[facility] EMERGENCY DEPARTMENT Provider Note   CSN: [id number] Arrival date & time: 09/05/19  1854     History Chief Complaint  Patient presents with  . 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  The patient reports he was working yesterday evening down on all fours and crawling on his knees.  Began having pain in his left knee which is worse overlying the patella.  The pain is worse with particular leg movements such as sitting down or getting up from a chair.  Otherwise he has minimal pain.  Is able to ambulate.  Is never had any infections of the knee or issues with his knee like this before.  He denies any falls or trauma on his knee.  Denies any hx of IVDA.  NKDA  HPI     Past Medical History:  Diagnosis Date  . Seasonal allergies     Patient Active Problem List   Diagnosis Date Noted  . Strain of fourth finger of left hand 09/13/2011    Past Surgical History:  Procedure Laterality Date  . HEMORRHOID SURGERY         Family History  Problem Relation Age of Onset  . Hypertension Mother   . Sudden death Neg Hx   . Hyperlipidemia Neg Hx   . Heart attack Neg Hx   . Diabetes Neg Hx     Social History   Tobacco Use  . Smoking status: Former Smoker    Quit date: 03/30/1992    Years since quitting: 27.4  . Smokeless tobacco: Never Used  Substance Use Topics  . Alcohol use: Yes    Alcohol/week: 0.0 standard drinks    Comment: 1 beer per day  . Drug use: No    Home Medications Prior to Admission medications   Medication Sig Start Date End Date Taking? Authorizing Provider  ibuprofen (ADVIL) 600 MG tablet Take 1 tablet (600 mg total) by mouth every 6 (six) hours as needed for up to 30 doses for mild pain or moderate pain. 09/05/19   Terald Sleeper, MD  terbinafine (LAMISIL) 250 MG tablet Take one tablet once daily x 7 days, then repeat every month x 4 months  02/18/16   Lenn Sink, DPM    Allergies    Patient has no known allergies.  Review of Systems   Review of Systems  Constitutional: Negative for chills and fever.  Respiratory: Negative for cough and shortness of breath.   Cardiovascular: Negative for chest pain and palpitations.  Gastrointestinal: Negative for nausea and vomiting.  Musculoskeletal: Positive for arthralgias and myalgias.  Skin: Negative for pallor and rash.  Neurological: Negative for syncope and light-headedness.  Psychiatric/Behavioral: Negative for agitation and confusion.  All other systems reviewed and are negative.   Physical Exam Updated Vital Signs BP (!) 142/97   Pulse 86   Temp 98.5 F (36.9 C)   Resp 16   Ht 5\' 10"  (1.778 m)   Wt 106.6 kg   SpO2 99%   BMI 33.72 kg/m   Physical Exam Vitals and nursing note reviewed.  Constitutional:      Appearance: He is well-developed.  HENT:     Head: Normocephalic and atraumatic.  Eyes:     Conjunctiva/sclera: Conjunctivae normal.  Cardiovascular:     Rate and Rhythm: Normal rate and regular rhythm.     Pulses: Normal pulses.  Pulmonary:     Effort: Pulmonary effort is normal. No respiratory distress.  Musculoskeletal:     Cervical back: Neck supple.     Comments: Prepatellar tenderness, minor warmth and joint effusion Full ROM of the left knee without significant pain No isolate ttp of the patellar head or fibula  Skin:    General: Skin is warm and dry.  Neurological:     General: No focal deficit present.     Mental Status: He is alert and oriented to person, place, and time.     Sensory: No sensory deficit.  Psychiatric:        Mood and Affect: Mood normal.        Behavior: Behavior normal.     ED Results / Procedures / Treatments   Labs (all labs ordered are listed, but only abnormal results are displayed) Labs Reviewed - No data to display  EKG None  Radiology No results found.  Procedures Procedures (including critical  care time)  Medications Ordered in ED Medications - No data to display  ED Course  I have reviewed the triage vital signs and the nursing notes.  Pertinent labs & imaging results that were available during my care of the patient were reviewed by me and considered in my medical decision making (see chart for details).  64 year old male presenting to the emergency department with left knee pain after spending a lot of time at work on his hands and knees.  Suspect this is a prepatellar bursitis.  He has very mild effusion and some warmth in the joint space.  However there is no significant tenderness or pain with range of motion, no fever, no open wounds, and no IVDA for me to suspect that this is a septic joint.    Likewise no traumatic mechanism to suspect that he has any fracture.  I do not believe he needs x-rays of the knee at this time.  Recommended conservative management with NSAIDs and ice.  Advised that he try to keep off of his knees as much as possible.    Final Clinical Impression(s) / ED Diagnoses Final diagnoses:  Prepatellar bursitis of left knee    Rx / DC Orders ED Discharge Orders         Ordered    ibuprofen (ADVIL) 600 MG tablet  Every 6 hours PRN     09/05/19 1941           Wyvonnia Dusky, MD 09/05/19 2341

## 2019-09-05 NOTE — ED Triage Notes (Addendum)
Pt c/o right knee pain x 1 day

## 2019-09-05 NOTE — Discharge Instructions (Addendum)
I diagnosed Donald Donald Choi with prepatellar bursitis in the emergency department today.  As I explained to Donald Choi, we do not need an x-ray of his knee to confirm this clinical diagnosis. This is an inflammatory condition involving the ligaments and bursa of the knee.  I advised that he rest his knee and keep off of his knee for the next 10-14 days.  Afterwards he can return to his work duties, but should wear knee pads and avoid spending more than an hour on his knees per shift.  This condition can recur with continued trauma or prolonged time spent on the knee.  Whenever possible, he should attempt to work in a way that does not require kneeling or putting direct pressure on his knee caps.

## 2019-09-05 NOTE — ED Notes (Signed)
Left w/o being seen 2/2 to employer directing pt to another facility for workers' comp.

## 2020-07-04 IMAGING — MR MR PROSTATE WO/W CM
23 of 56 series · 23 of 56 positions shown · IV contrast (yes)
Comparison: None.

CLINICAL DATA: Elevated PSA.  No prostate biopsy

EXAM:
MR PROSTATE WITHOUT AND WITH CONTRAST
TECHNIQUE: Multiplanar multisequence MRI images were obtained of the pelvis
centered about the prostate. Pre and post contrast images were
obtained. Endo rectal coil placement was attempted twice
unsuccessfully. Anal sphincter too tight. Possible related to prior
surgery.
CONTRAST:  10 mL Gadavist

[Series 1: (id) loc · axial · 8.0mm · 0.78mm/px · 1 of 17 slices shown]
[im 1/17]
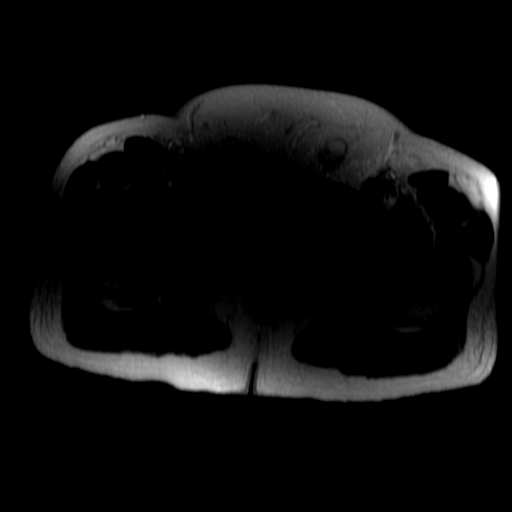

[Series 3: bSSFP fat-sat · axial · 6.0mm · 0.86mm/px · 1 of 49 slices shown]
[im 1/49]
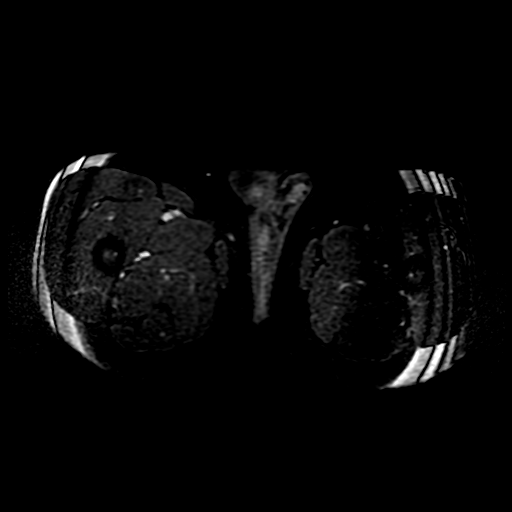

[Series 4: T1 · axial · 6.0mm · 0.86mm/px · 1 of 49 slices shown (1 of 2)]
[im 1/49]
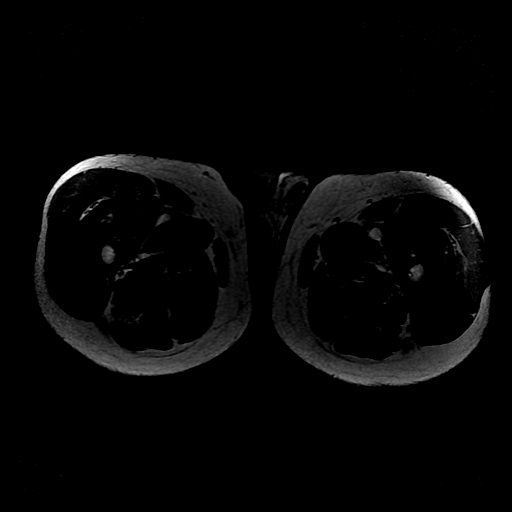

[Series 5: T2 · axial · 3.0mm · 0.29mm/px · 1 of 33 slices shown (1 of 4)]
[im 1/33]
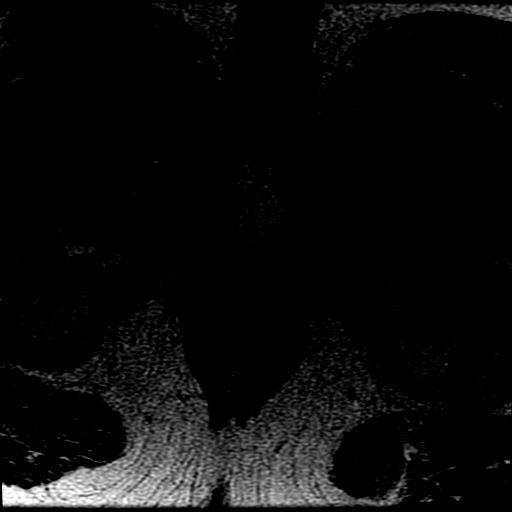

[Series 6: T2 · sagittal · 4.0mm · 0.29mm/px · 1 of 24 slices shown (2 of 4)]
[im 1/24]
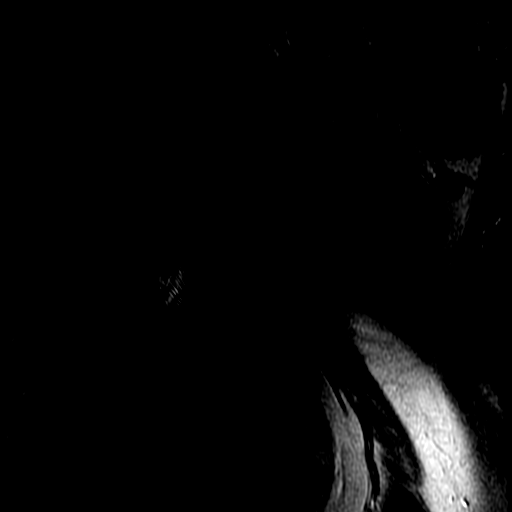

[Series 7: T1 · axial · 3.0mm · 0.29mm/px · 1 of 33 slices shown (2 of 2)]
[im 1/33]
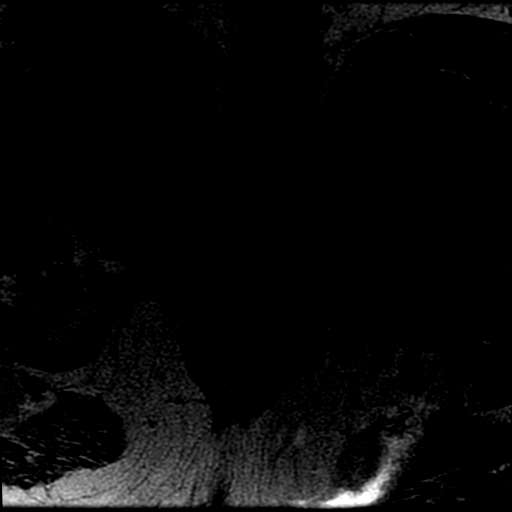

[Series 8: T2 · axial · 1.8mm · 0.47mm/px · 1 of 156 slices shown (3 of 4)]
[im 1/156]
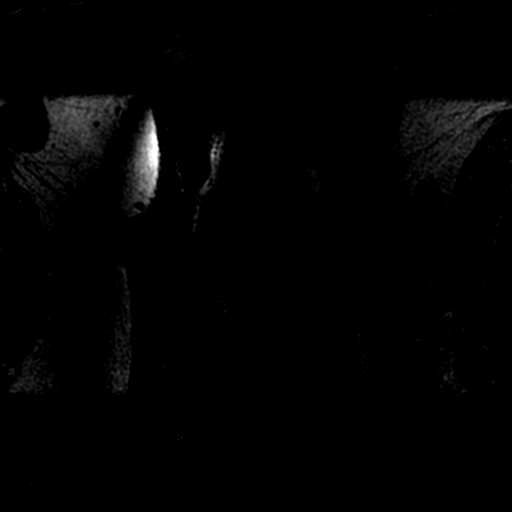

[Series 9: T2 · coronal · 4.0mm · 0.29mm/px · 1 of 26 slices shown (4 of 4)]
[im 1/26]
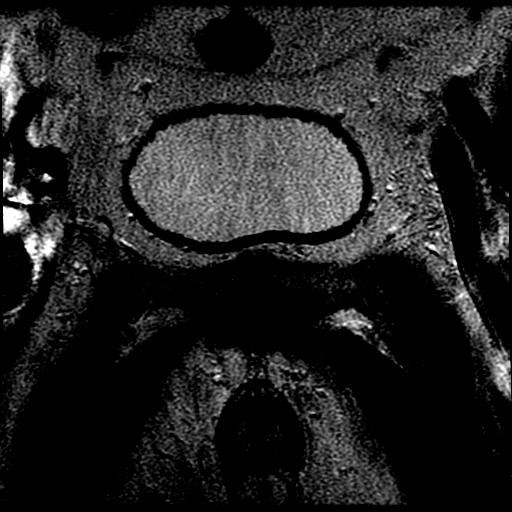

[Series 10: DWI · axial · 3.0mm · 0.59mm/px · 1 of 66 slices shown (1 of 6)]
[im 1/66]
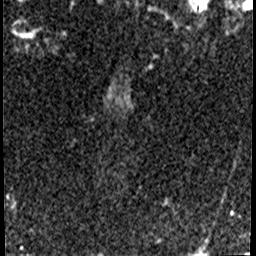

[Series 11: DWI · axial · 3.0mm · 0.59mm/px · 1 of 50 slices shown (2 of 6)]
[im 1/50]
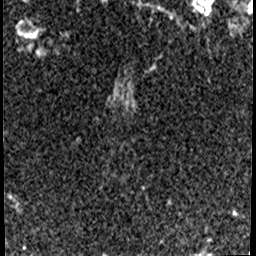

[Series 12: DWI · axial · 3.0mm · 0.59mm/px · 1 of 49 slices shown (3 of 6)]
[im 1/49]
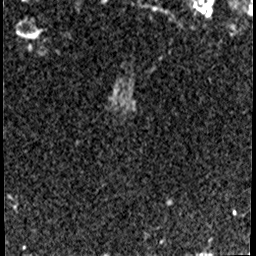

[Series 800: reformatted · axial · 1.8mm · 0.47mm/px · 1 of 103 slices shown (1 of 2)]
[im 1/103]
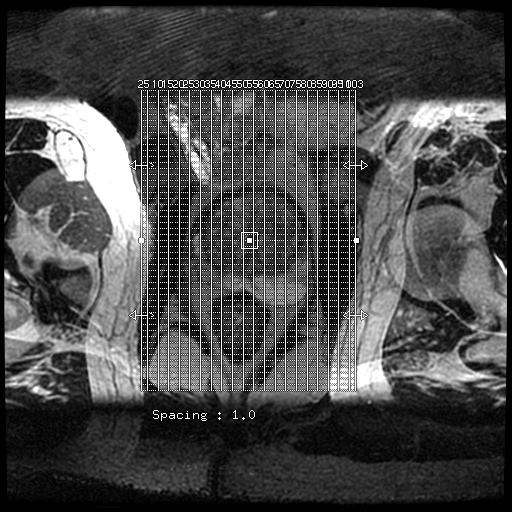

[Series 801: reformatted · axial · 1.8mm · 0.47mm/px · 1 of 91 slices shown (2 of 2)]
[im 1/91]
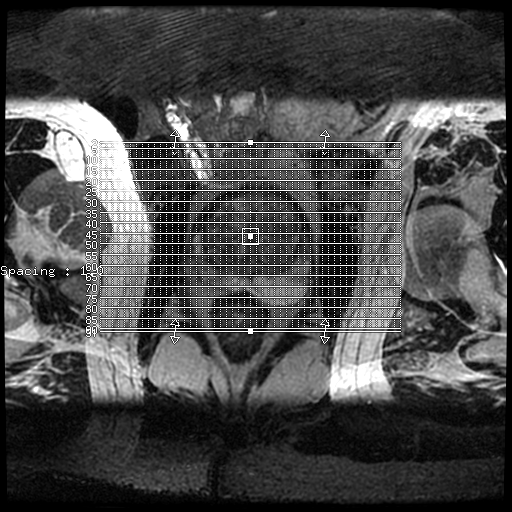

[Series 1000: DWI · axial · 3.0mm · 0.59mm/px · 1 of 33 slices shown (4 of 6)]
[im 1/33]
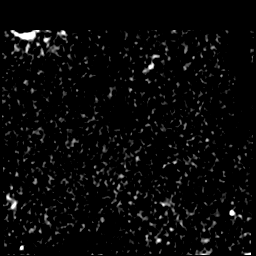

[Series 1100: DWI · axial · 3.0mm · 0.59mm/px · 1 of 33 slices shown (5 of 6)]
[im 1/33]
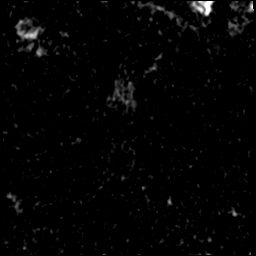

[Series 1200: DWI · axial · 3.0mm · 0.59mm/px · 1 of 33 slices shown (6 of 6)]
[im 1/33]
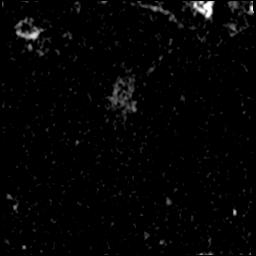

[((id)/(id)/1)-((id)/(id)/1) · axial · 3.0mm · 0.43mm/px · 1 of 63 slices shown (1 of 7)]
[im 1/63]
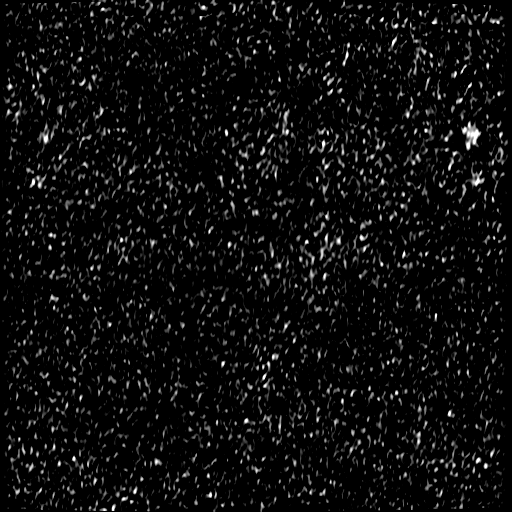

[((id)/(id)/1)-((id)/(id)/1) · axial · 3.0mm · 0.43mm/px · 1 of 76 slices shown (2 of 7)]
[im 1/76]
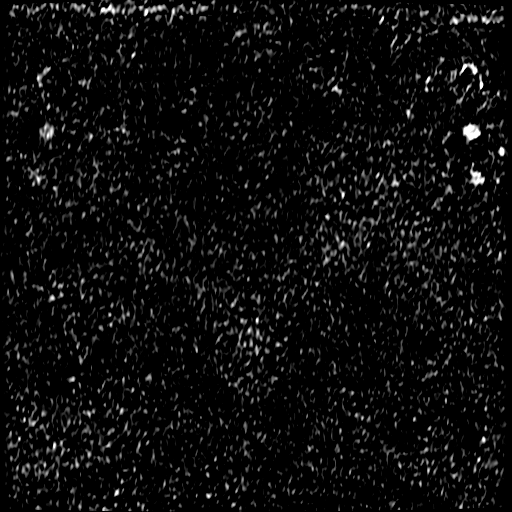

[((id)/(id)/1)-((id)/(id)/1) · axial · 3.0mm · 0.43mm/px · 1 of 76 slices shown (3 of 7)]
[im 1/76]
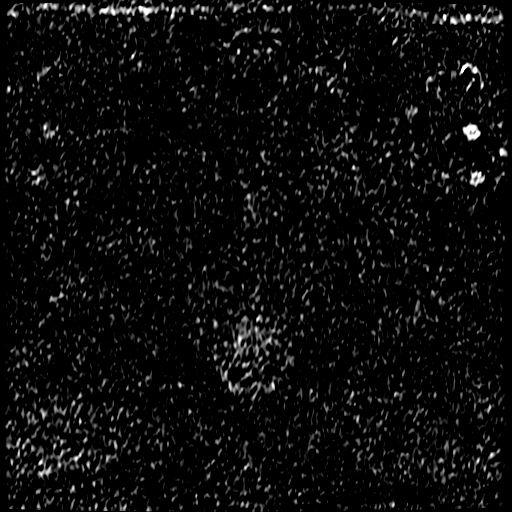

[((id)/(id)/1)-((id)/(id)/1) · axial · 3.0mm · 0.43mm/px · 1 of 76 slices shown (4 of 7)]
[im 1/76]
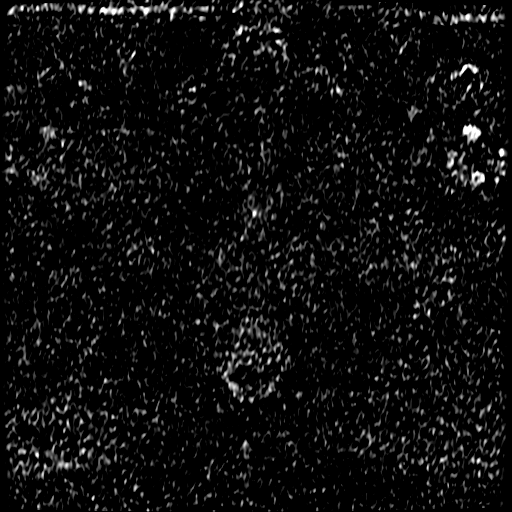

[((id)/(id)/1)-((id)/(id)/1) · axial · 3.0mm · 0.43mm/px · 1 of 76 slices shown (5 of 7)]
[im 1/76]
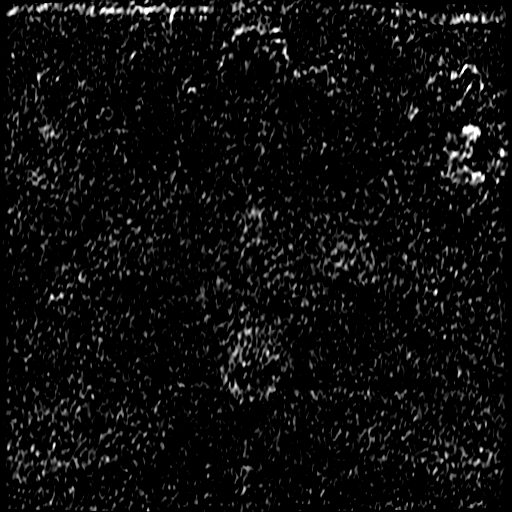

[((id)/(id)/1)-((id)/(id)/1) · axial · 3.0mm · 0.43mm/px · 1 of 76 slices shown (6 of 7)]
[im 1/76]
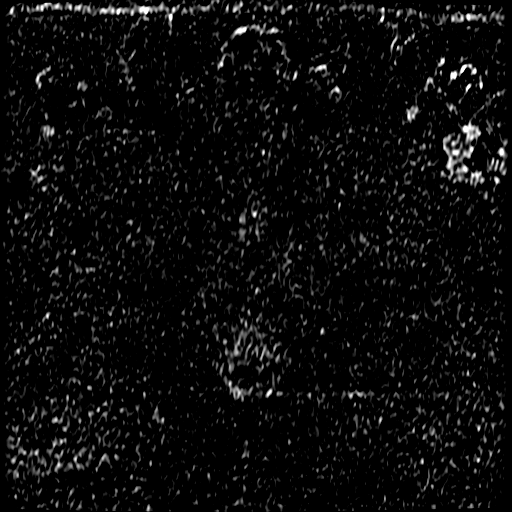

[((id)/(id)/1)-((id)/(id)/1) · axial · 3.0mm · 0.43mm/px · 1 of 76 slices shown (7 of 7)]
[im 1/76]
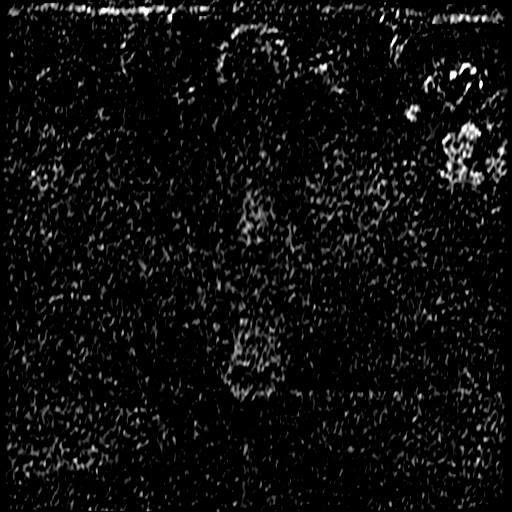

[23 of 56 positions shown; findings below may reference images not displayed]

FINDINGS: Prostate: Normal high T2 signal within the peripheral zone. No
discrete lesions. The RIGHT peripheral zone is smaller than the LEFT
peripheral zone.

Capsulated nodules in the transitional zone

No foci of restricted diffusion in the peripheral zone. No abnormal
enhancement.

Volume: 5.2 x 6.1 by 5.7 cm (volume = 95 cm^3)

Transcapsular spread:  Absent

Seminal vesicle involvement: Absent

Neurovascular bundle involvement: Absent

Pelvic adenopathy: Absent

Bone metastasis: Absent

Other findings:
IMPRESSION: 1. No high-grade carcinoma in the peripheral zone.
2. Nodular transitional zone most consistent benign prostate
hypertrophy.
3. Prostatomegaly  (volume = 95 cm^3).
4. In the future, if diagnostic MRI is chosen for evaluation,
recommend 3 T magnet as patient could not receive the endorectal
coil.
